# Patient Record
Sex: Female | Born: 1955 | Race: White | Hispanic: No | Marital: Single | Smoking: Former smoker
Health system: Southern US, Community
[De-identification: ages and names within clinical notes are randomized; demographics above are authoritative.]

## PROBLEM LIST (undated history)

## (undated) DIAGNOSIS — I471 Supraventricular tachycardia: Secondary | ICD-10-CM

---

## 2013-08-26 ENCOUNTER — Emergency Department (HOSPITAL_BASED_OUTPATIENT_CLINIC_OR_DEPARTMENT_OTHER)
Admission: EM | Admit: 2013-08-26 | Discharge: 2013-08-26 | Disposition: A | Discharge status: Home / Self Care | Payer: Federal, State, Local not specified - PPO | Attending: Emergency Medicine | Admitting: Emergency Medicine

## 2013-08-26 ENCOUNTER — Encounter (HOSPITAL_BASED_OUTPATIENT_CLINIC_OR_DEPARTMENT_OTHER): Payer: Self-pay | Admitting: Emergency Medicine

## 2013-08-26 ENCOUNTER — Emergency Department (HOSPITAL_BASED_OUTPATIENT_CLINIC_OR_DEPARTMENT_OTHER): Payer: Federal, State, Local not specified - PPO

## 2013-08-26 DIAGNOSIS — R002 Palpitations: Secondary | ICD-10-CM | POA: Insufficient documentation

## 2013-08-26 DIAGNOSIS — I471 Supraventricular tachycardia: Secondary | ICD-10-CM

## 2013-08-26 DIAGNOSIS — R51 Headache: Secondary | ICD-10-CM | POA: Insufficient documentation

## 2013-08-26 DIAGNOSIS — I498 Other specified cardiac arrhythmias: Secondary | ICD-10-CM | POA: Insufficient documentation

## 2013-08-26 LAB — COMPREHENSIVE METABOLIC PANEL
ALT: 34 U/L (ref 0–35)
AST: 30 U/L (ref 0–37)
Alkaline Phosphatase: 144 U/L — ABNORMAL HIGH (ref 39–117)
CO2: 27 mEq/L (ref 19–32)
Calcium: 10.7 mg/dL — ABNORMAL HIGH (ref 8.4–10.5)
Chloride: 103 mEq/L (ref 96–112)
GFR calc Af Amer: 72 mL/min — ABNORMAL LOW (ref >90)
GFR calc non Af Amer: 62 mL/min — ABNORMAL LOW (ref >90)
Glucose, Bld: 103 mg/dL — ABNORMAL HIGH (ref 70–99)
Sodium: 143 mEq/L (ref 135–145)
Total Bilirubin: 0.3 mg/dL (ref 0.3–1.2)

## 2013-08-26 LAB — CBC WITH DIFFERENTIAL/PLATELET
Basophils Relative: 0 % (ref 0–1)
Eosinophils Relative: 2 % (ref 0–5)
HCT: 45 % (ref 36.0–46.0)
Lymphocytes Relative: 43 % (ref 12–46)
Lymphs Abs: 3.3 10*3/uL (ref 0.7–4.0)
MCH: 29.8 pg (ref 26.0–34.0)
MCV: 86.5 fL (ref 78.0–100.0)
Platelets: 314 10*3/uL (ref 150–400)
RBC: 5.2 MIL/uL — ABNORMAL HIGH (ref 3.87–5.11)
RDW: 13 % (ref 11.5–15.5)
WBC: 7.8 10*3/uL (ref 4.0–10.5)

## 2013-08-26 MED ORDER — IBUPROFEN 200 MG PO TABS
600.0000 mg | ORAL_TABLET | Freq: Once | ORAL | Status: AC
  Administered 2013-08-26: 600 mg via ORAL
  Filled 2013-08-26 (×2): qty 1

## 2013-08-26 NOTE — ED Notes (Signed)
Patient expressed concern that we did not ask her permission to perform an ekg or run labs. Explained to patient  the reason for each procedure and the time factor for these procedures.  Explained the results of each test and patient verbalized understanding.

## 2013-08-26 NOTE — ED Notes (Signed)
Instructed pt to bear down x2 after EKG showed SVT @ 140.   Pt converted to sinus tachycardia. See second EKG.

## 2013-08-26 NOTE — ED Notes (Signed)
Patient c/o rapid heart rate since around 9:30 this morning.Patient states that she has had episodes in the past that only last around 5 minutes.no sob, some fatigue

## 2013-08-26 NOTE — ED Provider Notes (Signed)
CSN: [id number]     Arrival date & time 08/26/13  1012 History   First MD Initiated Contact with Patient 08/26/13 1025     Chief Complaint  Patient presents with  . Tachycardia   (Consider location/radiation/quality/duration/timing/severity/associated sxs/prior Treatment) HPI Comments: Patient is a 57 year old female with no significant past medical history. She presents today with complaints of rapid heart rate. She states that she woke this morning feeling well, then shortly afterward began with palpitations. She states that she has a fast heart rate that she says was in the 170s. This made her feel lightheaded and she actually developed a headache. She denies any chest pain or shortness of breath. She does admit to having episodes like this in the past, however these have only lasted for several minutes. She does report drinking 2 cups of coffee per day and he just finished drinking a coffee from [employer] just prior to the onset of the symptoms.  Patient is a 57 y.o. female presenting with palpitations. The history is provided by the patient.  Palpitations Palpitations quality:  Fast Onset quality:  Sudden Duration:  2 hours Timing:  Constant Progression:  Resolved Chronicity:  Recurrent Context: caffeine   Context: not anxiety and not appetite suppressants   Relieved by:  Nothing Worsened by:  Nothing tried Ineffective treatments:  None tried Associated symptoms: no chest pain, no nausea and no shortness of breath     History reviewed. No pertinent past medical history. Past Surgical History  Procedure Laterality Date  . Cesarean section     No family history on file. History  Substance Use Topics  . Smoking status: Never Smoker   . Smokeless tobacco: Not on file  . Alcohol Use: Yes   OB History   Grav Para Term Preterm Abortions TAB SAB Ect Mult Living                 Review of Systems  Respiratory: Negative for shortness of breath.   Cardiovascular: Positive for  palpitations. Negative for chest pain.  Gastrointestinal: Negative for nausea.  All other systems reviewed and are negative.    Allergies  Aspirin and Sulfa antibiotics  Home Medications  No current outpatient prescriptions on file. BP 149/99  Pulse 152  Temp(Src) 98 F (36.7 C)  Resp 20  SpO2 100% Physical Exam  Nursing note and vitals reviewed. Constitutional: She is oriented to person, place, and time. She appears well-developed and well-nourished. No distress.  HENT:  Head: Normocephalic and atraumatic.  Neck: Normal range of motion. Neck supple.  Cardiovascular: Normal rate and regular rhythm.  Exam reveals no gallop and no friction rub.   No murmur heard. Pulmonary/Chest: Effort normal and breath sounds normal. No respiratory distress. She has no wheezes.  Abdominal: Soft. Bowel sounds are normal. She exhibits no distension. There is no tenderness.  Musculoskeletal: Normal range of motion.  Neurological: She is alert and oriented to person, place, and time. No cranial nerve deficit. She exhibits normal muscle tone. Coordination normal.  Skin: Skin is warm and dry. She is not diaphoretic.    ED Course  Procedures (including critical care time) Labs Review Labs Reviewed - No data to display Imaging Review No results found.  EKG Interpretation     Ventricular Rate:  140 PR Interval:    QRS Duration: 84 QT Interval:  294 QTC Calculation: 448 R Axis:   18 Text Interpretation:  Supraventricular tachycardia Otherwise normal ECG  MDM  No diagnosis found. Patient is a 57 year old female presents with rapid heart rate. She is brought back from triage and initial EKG revealed a supraventricular tachycardia with rate of 140. This was converted to sinus rhythm with vagal maneuvers. Repeat EKG reveals a normal sinus rhythm with no acute changes. Workup reveals normal electrolytes, negative troponin, an unremarkable CBC. A TSH is pending at this time.    She is also complaining of headache was concerned that she had an aneurysm. Her neurologic exam is nonfocal.  CT scan of the head reveals no acute intracranial abnormalities.  She was observed in the ER for an extended period of time and had no further SVT or other ectopy. I believe she is stable for discharge to home. She will be given the number for cardiology to arrange a followup appointment to discuss her options.    Geoffery Lyons, MD 08/26/13 [phone]

## 2013-09-04 ENCOUNTER — Ambulatory Visit (INDEPENDENT_AMBULATORY_CARE_PROVIDER_SITE_OTHER): Payer: Federal, State, Local not specified - PPO | Admitting: Internal Medicine

## 2013-09-04 ENCOUNTER — Encounter: Payer: Self-pay | Admitting: Internal Medicine

## 2013-09-04 VITALS — BP 172/92 | HR 75 | Ht 66.0 in | Wt 178.4 lb

## 2013-09-04 DIAGNOSIS — R002 Palpitations: Secondary | ICD-10-CM

## 2013-09-04 DIAGNOSIS — I471 Supraventricular tachycardia: Secondary | ICD-10-CM

## 2013-09-04 DIAGNOSIS — I498 Other specified cardiac arrhythmias: Secondary | ICD-10-CM

## 2013-09-04 NOTE — Patient Instructions (Signed)
Your physician has requested that you have an echocardiogram. Echocardiography is a painless test that uses sound waves to create images of your heart. It provides your doctor with information about the size and shape of your heart and how well your heart's chambers and valves are working. This procedure takes approximately one hour. There are no restrictions for this procedure.  Please schedule a follow up visit after your test in about 2-3 weeks.

## 2013-09-04 NOTE — Progress Notes (Signed)
OFFICE NOTE  Chief Complaint:  Palpitations, SVT  Primary Care Physician: Rosario Adie, MD  HPI:  Ruth Erickson is a pleasant 57 year old female with no significant medical problems. She reports being fairly healthy and active. She exercises several times a week however recently he has been very active at work and has not been exercising as much as she had in the past. Has been associated with a few pounds of weight gain. On Sunday she reported getting ready for church and went to buy a [employer] coffee. She has coffee a few times a week. She did not yet drank any of the coffee when she felt that she had onset of palpitations and rapid heartbeat. The symptoms have presented previously over the past several years but typically when away within a few seconds. This time the symptoms did not go away and persisted for 10 or 15 minutes at that time she decided to go [facility] which was down the street from her home.  At [facility] she was noted to have a pulse in the 140s with a regular tachycardia. She reported taking her pulse when the fast rhythm started and it was over 200. She did feel slightly dizzy with this, but this did improve when she got to the emergency room. I personally reviewed her EKGs from the emergency room which demonstrated a small retrograde P wave after the QRS, indicating probably an accessory pathway. In the emergency room she underwent vagal maneuvers which eventually slowed her rhythm and then he converted back to sinus.  She was discharged with followup in our office.  She denied any chest pain or worsening shortness of breath with this.  PMHx:  History reviewed. No pertinent past medical history.  Past Surgical History  Procedure Laterality Date  . Cesarean section      FAMHx:  Family History  Problem Relation Age of Onset  . Hypertension Mother   . Diabetes Mother   . Asthma Father     SOCHx:   reports that she has quit smoking.  Her smoking use included Cigarettes. She has a 7.5 pack-year smoking history. She does not have any smokeless tobacco history on file. She reports that she drinks alcohol. She reports that she does not use illicit drugs.  ALLERGIES:  Allergies  Allergen Reactions  . Aspirin   . Sulfa Antibiotics     ROS: A comprehensive review of systems was negative except for: Cardiovascular: positive for palpitations Neurological: positive for dizziness  HOME MEDS: Current Outpatient Prescriptions  Medication Sig Dispense Refill  . CLOBEX SPRAY 0.05 % external spray       . multivitamin-iron-minerals-folic acid (CENTRUM) chewable tablet Chew 1 tablet by mouth daily.      . NON FORMULARY Take by mouth daily. oatstraw      . PATADAY 0.2 % SOLN       . triamcinolone lotion (KENALOG) 0.1 %        No current facility-administered medications for this visit.    LABS/IMAGING: No results found for this or any previous visit (from the past 48 hour(s)). No results found.  VITALS: BP 172/92  Pulse 75  Ht 5\' 6"  (1.676 m)  Wt 178 lb 6.4 oz (80.922 kg)  BMI 28.81 kg/m2  EXAM: General appearance: alert and no distress Neck: no carotid bruit and no JVD Lungs: clear to auscultation bilaterally Heart: regular rate and rhythm, S1, S2 normal, no murmur, click, rub or gallop Abdomen:  soft, non-tender; bowel sounds normal; no masses,  no organomegaly Extremities: extremities normal, atraumatic, no cyanosis or edema Pulses: 2+ and symmetric Skin: Skin color, texture, turgor normal. No rashes or lesions Neurologic: Grossly normal Psych: Mood, affect normal  EKG: deferred  ASSESSMENT: 1. Supraventricular tachycardia, likely AVNRT, symptomatic  PLAN: 1.   Ruth Erickson had acute onset tachypalpitations, which she's had on and off for several years, however this episode was associated with some dizziness and very rapid heart rate. He did seem to improve with vagal maneuvers and ultimately she broke back  into sinus rhythm. She denied any chest pain or shortness of breath with this episode. There is no family history of heart disease or arrhythmias. She does not have hypertension - a recheck of her blood pressure in the office today was 124/84. We had a long discussion about these type of tachycardia arrhythmias and their potential management options. They can be sometimes associated with structural heart disease, however do not hear any valvular abnormalities. I would recommend an echocardiogram to evaluate atrial sizes and left ventricular function. I've recommended low-dose beta blocker, but she is hesitant to take medicine.  I've also advised her that she could consider possible ablation for this arrhythmia and am happy to refer her to a cardiac electrophysiologist to discuss this further. She wishes to consider both options and will let me know once she comes back to discuss the results of her echocardiogram.  Chrystie Nose, MD, [facility] Attending Cardiologist [facility]  Elen Acero C 09/04/2013, 11:07 AM

## 2013-09-20 ENCOUNTER — Inpatient Hospital Stay (HOSPITAL_COMMUNITY): Admission: RE | Admit: 2013-09-20 | Payer: Federal, State, Local not specified - PPO | Source: Ambulatory Visit

## 2013-10-08 ENCOUNTER — Ambulatory Visit (HOSPITAL_COMMUNITY)
Admission: RE | Admit: 2013-10-08 | Discharge: 2013-10-08 | Disposition: A | Discharge status: Home / Self Care | Payer: Federal, State, Local not specified - PPO | Source: Ambulatory Visit | Attending: Cardiovascular Disease | Admitting: Cardiovascular Disease

## 2013-10-08 ENCOUNTER — Ambulatory Visit: Payer: Federal, State, Local not specified - PPO | Admitting: Internal Medicine

## 2013-10-08 DIAGNOSIS — I10 Essential (primary) hypertension: Secondary | ICD-10-CM | POA: Insufficient documentation

## 2013-10-08 DIAGNOSIS — F17200 Nicotine dependence, unspecified, uncomplicated: Secondary | ICD-10-CM | POA: Insufficient documentation

## 2013-10-08 DIAGNOSIS — I471 Supraventricular tachycardia, unspecified: Secondary | ICD-10-CM

## 2013-10-08 DIAGNOSIS — I498 Other specified cardiac arrhythmias: Secondary | ICD-10-CM | POA: Insufficient documentation

## 2013-10-08 DIAGNOSIS — R002 Palpitations: Secondary | ICD-10-CM

## 2013-10-08 NOTE — Progress Notes (Signed)
2D Echo Performed 10/08/2013    Clearence Ped, RCS

## 2013-10-10 ENCOUNTER — Ambulatory Visit (INDEPENDENT_AMBULATORY_CARE_PROVIDER_SITE_OTHER): Payer: Federal, State, Local not specified - PPO | Admitting: Internal Medicine

## 2013-10-10 ENCOUNTER — Encounter: Payer: Self-pay | Admitting: Internal Medicine

## 2013-10-10 VITALS — BP 140/88 | HR 76 | Ht 66.0 in | Wt 177.6 lb

## 2013-10-10 DIAGNOSIS — I340 Nonrheumatic mitral (valve) insufficiency: Secondary | ICD-10-CM

## 2013-10-10 DIAGNOSIS — I059 Rheumatic mitral valve disease, unspecified: Secondary | ICD-10-CM

## 2013-10-10 MED ORDER — METOPROLOL SUCCINATE ER 25 MG PO TB24: 12.5000 mg | ORAL_TABLET | Freq: Every day | ORAL | Status: DC

## 2013-10-10 NOTE — Patient Instructions (Signed)
Start Toprol XL 12.5mg  daily.  Your physician wants you to follow-up in: 1 year. You will receive a reminder letter in the mail two months in advance. If you don't receive a letter, please call our office to schedule the follow-up appointment.

## 2013-10-10 NOTE — Progress Notes (Signed)
OFFICE NOTE  Chief Complaint:  Palpitations, SVT  Primary Care Physician: Ruth Adie, MD  HPI:  Ruth Erickson is a pleasant 57 year old female with no significant medical problems. She reports being fairly healthy and active. She exercises several times a week however recently he has been very active at work and has not been exercising as much as she had in the past. Has been associated with a few pounds of weight gain. On Sunday she reported getting ready for church and went to buy a [employer] coffee. She has coffee a few times a week. She did not yet drank any of the coffee when she felt that she had onset of palpitations and rapid heartbeat. The symptoms have presented previously over the past several years but typically when away within a few seconds. This time the symptoms did not go away and persisted for 10 or 15 minutes at that time she decided to go to [facility] which was down the street from her home.  At [facility] she was noted to have a pulse in the 140s with a regular tachycardia. She reported taking her pulse when the fast rhythm started and it was over 200. She did feel slightly dizzy with this, but this did improve when she got to the emergency room. I personally reviewed her EKGs from the emergency room which demonstrated a small retrograde P wave after the QRS, indicating probably an accessory pathway. In the emergency room she underwent vagal maneuvers which eventually slowed her rhythm and then he converted back to sinus.  She was discharged with followup in our office.  She denied any chest pain or worsening shortness of breath with this.  Recently underwent an echocardiogram in our office which showed normal systolic function. There was some nodular sclerosis to the anterior mitral leaflet with mild to moderate mitral regurgitation. Otherwise no significant structural abnormalities.  PMHx:  History reviewed. No pertinent past medical  history.  Past Surgical History  Procedure Laterality Date  . Cesarean section      FAMHx:  Family History  Problem Relation Age of Onset  . Hypertension Mother   . Diabetes Mother   . Asthma Father     SOCHx:   reports that she quit smoking about 20 years ago. Her smoking use included Cigarettes. She has a 7.5 pack-year smoking history. She does not have any smokeless tobacco history on file. She reports that she drinks alcohol. She reports that she does not use illicit drugs.  ALLERGIES:  Allergies  Allergen Reactions  . Aspirin   . Sulfa Antibiotics     ROS: A comprehensive review of systems was negative except for: Cardiovascular: positive for palpitations Neurological: positive for dizziness  HOME MEDS: Current Outpatient Prescriptions  Medication Sig Dispense Refill  . CLOBEX SPRAY 0.05 % external spray       . multivitamin-iron-minerals-folic acid (CENTRUM) chewable tablet Chew 1 tablet by mouth daily.      . NON FORMULARY Take by mouth daily. oatstraw      . triamcinolone lotion (KENALOG) 0.1 %       . metoprolol succinate (TOPROL XL) 25 MG 24 hr tablet Take 0.5 tablets (12.5 mg total) by mouth daily.  15 tablet  11   No current facility-administered medications for this visit.    LABS/IMAGING: No results found for this or any previous visit (from the past 48 hour(s)). No results found.  VITALS: BP 140/88  Pulse 76  Ht 5\' 6"  (1.676 m)  Wt 177 lb 9.6 oz (80.559 kg)  BMI 28.68 kg/m2  EXAM: General appearance: alert and no distress Neck: no carotid bruit and no JVD Lungs: clear to auscultation bilaterally Heart: regular rate and rhythm, S1, S2 normal, no murmur, click, rub or gallop Abdomen: soft, non-tender; bowel sounds normal; no masses,  no organomegaly Extremities: extremities normal, atraumatic, no cyanosis or edema Pulses: 2+ and symmetric Skin: Skin color, texture, turgor normal. No rashes or lesions Neurologic: Grossly normal Psych: Mood,  affect normal  EKG: deferred  ASSESSMENT: 1. Supraventricular tachycardia, likely AVNRT, symptomatic 2. Mild to moderate mitral regurgitation  PLAN: 1.   [name] has mild to moderate mitral regurgitation on echo, but otherwise the heart looked structurally normal. She does have episodes of tachycardia palpitations every few months but not as serious as she had recently. I do think she would benefit from low-dose beta blocker, for a multitude of reasons. She is agreeable to taking low-dose Toprol-XL and I will plan to see her back annually or sooner if necessary.  Ruth Nose, MD, [facility] Attending Cardiologist [facility]  Ruth Erickson C 10/10/2013, 10:12 AM

## 2013-12-23 ENCOUNTER — Emergency Department (HOSPITAL_BASED_OUTPATIENT_CLINIC_OR_DEPARTMENT_OTHER): Payer: Federal, State, Local not specified - PPO

## 2013-12-23 ENCOUNTER — Emergency Department (HOSPITAL_BASED_OUTPATIENT_CLINIC_OR_DEPARTMENT_OTHER)
Admission: EM | Admit: 2013-12-23 | Discharge: 2013-12-23 | Disposition: A | Discharge status: Home / Self Care | Payer: Federal, State, Local not specified - PPO | Attending: Emergency Medicine | Admitting: Emergency Medicine

## 2013-12-23 ENCOUNTER — Encounter (HOSPITAL_BASED_OUTPATIENT_CLINIC_OR_DEPARTMENT_OTHER): Payer: Self-pay | Admitting: Emergency Medicine

## 2013-12-23 DIAGNOSIS — Z79899 Other long term (current) drug therapy: Secondary | ICD-10-CM | POA: Insufficient documentation

## 2013-12-23 DIAGNOSIS — D259 Leiomyoma of uterus, unspecified: Secondary | ICD-10-CM

## 2013-12-23 DIAGNOSIS — Z87891 Personal history of nicotine dependence: Secondary | ICD-10-CM | POA: Insufficient documentation

## 2013-12-23 DIAGNOSIS — R109 Unspecified abdominal pain: Secondary | ICD-10-CM

## 2013-12-23 DIAGNOSIS — K5900 Constipation, unspecified: Secondary | ICD-10-CM | POA: Insufficient documentation

## 2013-12-23 DIAGNOSIS — I498 Other specified cardiac arrhythmias: Secondary | ICD-10-CM | POA: Insufficient documentation

## 2013-12-23 HISTORY — DX: Supraventricular tachycardia: I47.1

## 2013-12-23 LAB — CBC WITH DIFFERENTIAL/PLATELET
Basophils Absolute: 0 10*3/uL (ref 0.0–0.1)
Basophils Relative: 0 % (ref 0–1)
Eosinophils Absolute: 0.2 10*3/uL (ref 0.0–0.7)
Eosinophils Relative: 2 % (ref 0–5)
HCT: 39.3 % (ref 36.0–46.0)
Hemoglobin: 13.5 g/dL (ref 12.0–15.0)
LYMPHS ABS: 3.4 10*3/uL (ref 0.7–4.0)
LYMPHS PCT: 37 % (ref 12–46)
MCH: 30.5 pg (ref 26.0–34.0)
MCHC: 34.4 g/dL (ref 30.0–36.0)
MCV: 88.7 fL (ref 78.0–100.0)
Monocytes Absolute: 0.6 10*3/uL (ref 0.1–1.0)
Monocytes Relative: 7 % (ref 3–12)
NEUTROS ABS: 5 10*3/uL (ref 1.7–7.7)
NEUTROS PCT: 54 % (ref 43–77)
PLATELETS: 271 10*3/uL (ref 150–400)
RBC: 4.43 MIL/uL (ref 3.87–5.11)
RDW: 13.9 % (ref 11.5–15.5)
WBC: 9.2 10*3/uL (ref 4.0–10.5)

## 2013-12-23 LAB — COMPREHENSIVE METABOLIC PANEL
ALK PHOS: 109 U/L (ref 39–117)
ALT: 12 U/L (ref 0–35)
AST: 20 U/L (ref 0–37)
Albumin: 3.9 g/dL (ref 3.5–5.2)
BUN: 12 mg/dL (ref 6–23)
CHLORIDE: 102 meq/L (ref 96–112)
CO2: 28 mEq/L (ref 19–32)
Calcium: 10.1 mg/dL (ref 8.4–10.5)
Creatinine, Ser: 0.8 mg/dL (ref 0.50–1.10)
GFR calc non Af Amer: 81 mL/min — ABNORMAL LOW (ref >90)
GLUCOSE: 92 mg/dL (ref 70–99)
POTASSIUM: 4.2 meq/L (ref 3.7–5.3)
SODIUM: 142 meq/L (ref 137–147)
TOTAL PROTEIN: 8 g/dL (ref 6.0–8.3)
Total Bilirubin: 0.3 mg/dL (ref 0.3–1.2)

## 2013-12-23 LAB — URINALYSIS, ROUTINE W REFLEX MICROSCOPIC
Bilirubin Urine: NEGATIVE
Glucose, UA: NEGATIVE mg/dL
Hgb urine dipstick: NEGATIVE
Ketones, ur: NEGATIVE mg/dL
NITRITE: NEGATIVE
Protein, ur: NEGATIVE mg/dL
Specific Gravity, Urine: 1.023 (ref 1.005–1.030)
UROBILINOGEN UA: 0.2 mg/dL (ref 0.0–1.0)
pH: 6.5 (ref 5.0–8.0)

## 2013-12-23 LAB — URINE MICROSCOPIC-ADD ON

## 2013-12-23 MED ORDER — IOHEXOL 300 MG/ML  SOLN
50.0000 mL | Freq: Once | INTRAMUSCULAR | Status: AC | PRN
  Administered 2013-12-23: 50 mL via ORAL

## 2013-12-23 MED ORDER — IBUPROFEN 800 MG PO TABS: 800.0000 mg | ORAL_TABLET | Freq: Three times a day (TID) | ORAL | Status: DC

## 2013-12-23 MED ORDER — KETOROLAC TROMETHAMINE 30 MG/ML IJ SOLN
30.0000 mg | Freq: Once | INTRAMUSCULAR | Status: AC
  Administered 2013-12-23: 30 mg via INTRAVENOUS
  Filled 2013-12-23: qty 1

## 2013-12-23 MED ORDER — IOHEXOL 300 MG/ML  SOLN
100.0000 mL | Freq: Once | INTRAMUSCULAR | Status: AC | PRN
  Administered 2013-12-23: 100 mL via INTRAVENOUS

## 2013-12-23 NOTE — Discharge Instructions (Signed)
Abdominal Pain, Women °Abdominal (stomach, pelvic, or belly) pain can be caused by many things. It is important to tell your doctor: °· The location of the pain. °· Does it come and go or is it present all the time? °· Are there things that start the pain (eating certain foods, exercise)? °· Are there other symptoms associated with the pain (fever, nausea, vomiting, diarrhea)? °All of this is helpful to know when trying to find the cause of the pain. °CAUSES  °· Stomach: virus or bacteria infection, or ulcer. °· Intestine: appendicitis (inflamed appendix), regional ileitis (Crohn's disease), ulcerative colitis (inflamed colon), irritable bowel syndrome, diverticulitis (inflamed diverticulum of the colon), or cancer of the stomach or intestine. °· Gallbladder disease or stones in the gallbladder. °· Kidney disease, kidney stones, or infection. °· Pancreas infection or cancer. °· Fibromyalgia (pain disorder). °· Diseases of the female organs: °· Uterus: fibroid (non-cancerous) tumors or infection. °· Fallopian tubes: infection or tubal pregnancy. °· Ovary: cysts or tumors. °· Pelvic adhesions (scar tissue). °· Endometriosis (uterus lining tissue growing in the pelvis and on the pelvic organs). °· Pelvic congestion syndrome (female organs filling up with blood just before the menstrual period). °· Pain with the menstrual period. °· Pain with ovulation (producing an egg). °· Pain with an IUD (intrauterine device, birth control) in the uterus. °· Cancer of the female organs. °· Functional pain (pain not caused by a disease, may improve without treatment). °· Psychological pain. °· Depression. °DIAGNOSIS  °Your doctor will decide the seriousness of your pain by doing an examination. °· Blood tests. °· X-rays. °· Ultrasound. °· CT scan (computed tomography, special type of X-ray). °· MRI (magnetic resonance imaging). °· Cultures, for infection. °· Barium enema (dye inserted in the large intestine, to better view it with  X-rays). °· Colonoscopy (looking in intestine with a lighted tube). °· Laparoscopy (minor surgery, looking in abdomen with a lighted tube). °· Major abdominal exploratory surgery (looking in abdomen with a large incision). °TREATMENT  °The treatment will depend on the cause of the pain.  °· Many cases can be observed and treated at home. °· Over-the-counter medicines recommended by your caregiver. °· Prescription medicine. °· Antibiotics, for infection. °· Birth control pills, for painful periods or for ovulation pain. °· Hormone treatment, for endometriosis. °· Nerve blocking injections. °· Physical therapy. °· Antidepressants. °· Counseling with a psychologist or psychiatrist. °· Minor or major surgery. °HOME CARE INSTRUCTIONS  °· Do not take laxatives, unless directed by your caregiver. °· Take over-the-counter pain medicine only if ordered by your caregiver. Do not take aspirin because it can cause an upset stomach or bleeding. °· Try a clear liquid diet (broth or water) as ordered by your caregiver. Slowly move to a bland diet, as tolerated, if the pain is related to the stomach or intestine. °· Have a thermometer and take your temperature several times a day, and record it. °· Bed rest and sleep, if it helps the pain. °· Avoid sexual intercourse, if it causes pain. °· Avoid stressful situations. °· Keep your follow-up appointments and tests, as your caregiver orders. °· If the pain does not go away with medicine or surgery, you may try: °· Acupuncture. °· Relaxation exercises (yoga, meditation). °· Group therapy. °· Counseling. °SEEK MEDICAL CARE IF:  °· You notice certain foods cause stomach pain. °· Your home care treatment is not helping your pain. °· You need stronger pain medicine. °· You want your IUD removed. °· You feel faint or   lightheaded.  You develop nausea and vomiting.  You develop a rash.  You are having side effects or an allergy to your medicine. SEEK IMMEDIATE MEDICAL CARE IF:   Your  pain does not go away or gets worse.  You have a fever.  Your pain is felt only in portions of the abdomen. The right side could possibly be appendicitis. The left lower portion of the abdomen could be colitis or diverticulitis.  You are passing blood in your stools (bright red or black tarry stools, with or without vomiting).  You have blood in your urine.  You develop chills, with or without a fever.  You pass out. MAKE SURE YOU:   Understand these instructions.  Will watch your condition.  Will get help right away if you are not doing well or get worse. Document Released: 08/15/2007 Document Revised: 01/10/2012 Document Reviewed: 09/04/2009 [facility] Patient Information 2014 [place], [place].  Fibroids Fibroids are lumps (tumors) that can occur any place in a woman's body. These lumps are not cancerous. Fibroids vary in size, weight, and where they grow. HOME CARE  Do not take aspirin.  Write down the number of pads or tampons you use during your period. Tell your doctor. This can help determine the best treatment for you. GET HELP RIGHT AWAY IF:  You have pain in your lower belly (abdomen) that is not helped with medicine.  You have cramps that are not helped with medicine.  You have more bleeding between or during your period.  You feel lightheaded or pass out (faint).  Your lower belly pain gets worse. MAKE SURE YOU:  Understand these instructions.  Will watch your condition.  Will get help right away if you are not doing well or get worse. Document Released: 11/20/2010 Document Revised: 01/10/2012 Document Reviewed: 11/20/2010 [facility] Patient Information 2014 [place], [place].

## 2013-12-23 NOTE — ED Notes (Signed)
Patient is having lower right abd pain, that radiates to right flank area and down right thigh.

## 2013-12-23 NOTE — ED Provider Notes (Signed)
Medical screening examination/treatment/procedure(s) were conducted as a shared visit with non-physician practitioner(s) and myself.  I personally evaluated the patient during the encounter.  EKG Interpretation   None         Merryl Hacker, MD 12/23/13 [place]ly [place]

## 2013-12-23 NOTE — ED Provider Notes (Signed)
5:46 PM Results for orders placed during the hospital encounter of 12/23/13  URINALYSIS, ROUTINE W REFLEX MICROSCOPIC      Result Value Ref Range   Color, Urine YELLOW  YELLOW   APPearance CLEAR  CLEAR   Specific Gravity, Urine 1.023  1.005 - 1.030   pH 6.5  5.0 - 8.0   Glucose, UA NEGATIVE  NEGATIVE mg/dL   Hgb urine dipstick NEGATIVE  NEGATIVE   Bilirubin Urine NEGATIVE  NEGATIVE   Ketones, ur NEGATIVE  NEGATIVE mg/dL   Protein, ur NEGATIVE  NEGATIVE mg/dL   Urobilinogen, UA 0.2  0.0 - 1.0 mg/dL   Nitrite NEGATIVE  NEGATIVE   Leukocytes, UA TRACE (*) NEGATIVE  CBC WITH DIFFERENTIAL      Result Value Ref Range   WBC 9.2  4.0 - 10.5 K/uL   RBC 4.43  3.87 - 5.11 MIL/uL   Hemoglobin 13.5  12.0 - 15.0 g/dL   HCT 39.3  36.0 - 46.0 %   MCV 88.7  78.0 - 100.0 fL   MCH 30.5  26.0 - 34.0 pg   MCHC 34.4  30.0 - 36.0 g/dL   RDW 13.9  11.5 - 15.5 %   Platelets 271  150 - 400 K/uL   Neutrophils Relative % 54  43 - 77 %   Neutro Abs 5.0  1.7 - 7.7 K/uL   Lymphocytes Relative 37  12 - 46 %   Lymphs Abs 3.4  0.7 - 4.0 K/uL   Monocytes Relative 7  3 - 12 %   Monocytes Absolute 0.6  0.1 - 1.0 K/uL   Eosinophils Relative 2  0 - 5 %   Eosinophils Absolute 0.2  0.0 - 0.7 K/uL   Basophils Relative 0  0 - 1 %   Basophils Absolute 0.0  0.0 - 0.1 K/uL  COMPREHENSIVE METABOLIC PANEL      Result Value Ref Range   Sodium 142  137 - 147 mEq/L   Potassium 4.2  3.7 - 5.3 mEq/L   Chloride 102  96 - 112 mEq/L   CO2 28  19 - 32 mEq/L   Glucose, Bld 92  70 - 99 mg/dL   BUN 12  6 - 23 mg/dL   Creatinine, Ser 0.80  0.50 - 1.10 mg/dL   Calcium 10.1  8.4 - 10.5 mg/dL   Total Protein 8.0  6.0 - 8.3 g/dL   Albumin 3.9  3.5 - 5.2 g/dL   AST 20  0 - 37 U/L   ALT 12  0 - 35 U/L   Alkaline Phosphatase 109  39 - 117 U/L   Total Bilirubin 0.3  0.3 - 1.2 mg/dL   GFR calc non Af Amer 81 (*) >90 mL/min   GFR calc Af Amer >90  >90 mL/min  URINE MICROSCOPIC-ADD ON      Result Value Ref Range   Squamous  Epithelial / LPF RARE  RARE   WBC, UA 3-6  <3 WBC/hpf   Bacteria, UA MANY (*) RARE   Dg Abd 1 View  12/23/2013   CLINICAL DATA:  Right lower quadrant pain  EXAM: ABDOMEN - 1 VIEW  COMPARISON:  None.  FINDINGS: No disproportionate dilatation of bowel. No obvious free intraperitoneal gas. Prominent stool burden throughout the colon. Unremarkable bony framework.  IMPRESSION: Nonobstructive bowel gas pattern.  Prominent stool.   Electronically Signed   By: Maryclare Bean M.D.   On: 12/23/2013 14:27   Ct Abdomen Pelvis W Contrast  12/23/2013  CLINICAL DATA:  Right lower quadrant flank pain, abdominal pain  EXAM: CT ABDOMEN AND PELVIS WITH CONTRAST  TECHNIQUE: Multidetector CT imaging of the abdomen and pelvis was performed using the standard protocol following bolus administration of intravenous contrast. Sagittal and coronal MPR images reconstructed from axial data set.  CONTRAST:  81mL OMNIPAQUE IOHEXOL 300 MG/ML SOLN, 160mL OMNIPAQUE IOHEXOL 300 MG/ML SOLN  COMPARISON:  None  FINDINGS: Lung bases clear.  Liver, spleen, pancreas, kidneys, and adrenal glands normal appearance.  Normal appendix.  Multiple masses in uterus likely uterine leiomyomata, largest 3.7 cm diameter.  Unremarkable bladder and ureters.  Stomach and bowel loops normal appearance.  No mass, adenopathy, free fluid or inflammatory process.  No acute osseous findings.  IMPRESSION: Fibroid uterus.  No acute intra-abdominal or intrapelvic abnormalities.   Electronically Signed   By: Lavonia Dana M.D.   On: 12/23/2013 17:04    Care of patient taken over from Dr. Dina Rich, review of CT scan reveals nothing to suggest the cause of the pain though the patient does state that the pain does feel like her previous fibroid pain.  She has an appointment in late March with her GYN for follow up and she will try to move this up from that point.  She requests ibuprofen for pain and does not want narcotic pain medication.    Idalia Needle Joelyn Oms, [place] 12/23/13  [phone]

## 2013-12-23 NOTE — ED Provider Notes (Signed)
CSN: [id number]     Arrival date & time 12/23/13  1333 History   First MD Initiated Contact with Patient 12/23/13 1353     Chief Complaint  Patient presents with  . Abdominal Pain     (Consider location/radiation/quality/duration/timing/severity/associated sxs/prior Treatment) HPI  This is a 58 yo female with history of SVT and cesarean section who presents with right lower quadrant pain. Patient reports onset of symptoms yesterday. She reports right flank right lower quadrant and right groin pain.  She describes the pain as aching sharp and constant. Positioning makes the pain better or worse. Patient took Tylenol home without relief. She denies any nausea, vomiting, diarrhea, urinary symptoms. She denies any history of hematuria or kidney stones.  Currently patient's pain is 7/10. Patient does feel that she has been constipated lately; however, she had 2 normal bowel movements this morning. She denies any blood in her stool.  Past Medical History  Diagnosis Date  . SVT (supraventricular tachycardia)    Past Surgical History  Procedure Laterality Date  . Cesarean section     Family History  Problem Relation Age of Onset  . Hypertension Mother   . Diabetes Mother   . Asthma Father    History  Substance Use Topics  . Smoking status: Former Smoker -- 0.50 packs/day for 15 years    Types: Cigarettes    Quit date: 10/10/1993  . Smokeless tobacco: Not on file  . Alcohol Use: Yes   OB History   Grav Para Term Preterm Abortions TAB SAB Ect Mult Living                 Review of Systems  Constitutional: Negative for fever.  Respiratory: Negative for cough, chest tightness and shortness of breath.   Cardiovascular: Negative for chest pain.  Gastrointestinal: Positive for abdominal pain and constipation. Negative for nausea, vomiting, diarrhea, blood in stool and rectal pain.  Genitourinary: Positive for flank pain. Negative for dysuria and hematuria.  Musculoskeletal: Negative  for back pain.  Neurological: Negative for headaches.  Psychiatric/Behavioral: Negative for confusion.  All other systems reviewed and are negative.      Allergies  Aspirin and Sulfa antibiotics  Home Medications   Current Outpatient Rx  Name  Route  Sig  Dispense  Refill  . CLOBEX SPRAY 0.05 % external spray               . metoprolol succinate (TOPROL XL) 25 MG 24 hr tablet   Oral   Take 0.5 tablets (12.5 mg total) by mouth daily.   15 tablet   11   . multivitamin-iron-minerals-folic acid (CENTRUM) chewable tablet   Oral   Chew 1 tablet by mouth daily.         . NON FORMULARY   Oral   Take by mouth daily. oatstraw         . triamcinolone lotion (KENALOG) 0.1 %                BP 159/76  Pulse 72  Temp(Src) 98.5 F (36.9 C) (Oral)  Resp 18  Wt 175 lb (79.379 kg)  SpO2 100% Physical Exam  Nursing note and vitals reviewed. Constitutional: She is oriented to person, place, and time. She appears well-developed and well-nourished. No distress.  HENT:  Head: Normocephalic and atraumatic.  Mouth/Throat: Oropharynx is clear and moist.  Neck: Neck supple.  Cardiovascular: Normal rate, regular rhythm and normal heart sounds.   No murmur heard. Pulmonary/Chest: Effort normal and  breath sounds normal. No respiratory distress. She has no wheezes.  Abdominal: Soft. Bowel sounds are normal. She exhibits no distension. There is no tenderness. There is no rebound and no guarding.  Genitourinary:  No CVA tenderness noted  Musculoskeletal: She exhibits no edema.  Neurological: She is alert and oriented to person, place, and time.  Skin: Skin is warm and dry.  Psychiatric: She has a normal mood and affect.    ED Course  Procedures (including critical care time) Labs Review Labs Reviewed  URINALYSIS, ROUTINE W REFLEX MICROSCOPIC - Abnormal; Notable for the following:    Leukocytes, UA TRACE (*)    All other components within normal limits  COMPREHENSIVE  METABOLIC PANEL - Abnormal; Notable for the following:    GFR calc non Af Amer 81 (*)    All other components within normal limits  URINE MICROSCOPIC-ADD ON - Abnormal; Notable for the following:    Bacteria, UA MANY (*)    All other components within normal limits  CBC WITH DIFFERENTIAL   Imaging Review Dg Abd 1 View  12/23/2013   CLINICAL DATA:  Right lower quadrant pain  EXAM: ABDOMEN - 1 VIEW  COMPARISON:  None.  FINDINGS: No disproportionate dilatation of bowel. No obvious free intraperitoneal gas. Prominent stool burden throughout the colon. Unremarkable bony framework.  IMPRESSION: Nonobstructive bowel gas pattern.  Prominent stool.   Electronically Signed   By: Maryclare Bean M.D.   On: 12/23/2013 14:27    EKG Interpretation   None      Medications  iohexol (OMNIPAQUE) 300 MG/ML solution 50 mL (not administered)  ketorolac (TORADOL) 30 MG/ML injection 30 mg (not administered)    MDM   Final diagnoses:  None    Patient presents with right flank, right lower quadrant, and right groin pain.  She is nontoxic on exam. No signs of peritonitis or tenderness. Lab work is reassuring without evidence of urinary tract infection with leukocytosis. Considerations include kidney stone, appendicitis although the patient is relatively nontender, or musculoskeletal. Patient will get a CT scan of the abdomen for further evaluation.    Merryl Hacker, MD 12/23/13 [phone]

## 2014-06-20 ENCOUNTER — Telehealth: Payer: Self-pay | Admitting: Internal Medicine

## 2014-06-20 NOTE — Telephone Encounter (Signed)
Forward to Dr Debara Pickett

## 2014-06-20 NOTE — Telephone Encounter (Signed)
Pt need to know if she can stop her Metoprolol for 3 days so she can have an allergy test.

## 2014-06-21 ENCOUNTER — Encounter: Payer: Self-pay | Admitting: *Deleted

## 2014-06-21 NOTE — Telephone Encounter (Signed)
Letter composed and emailed to patient.

## 2014-06-21 NOTE — Telephone Encounter (Signed)
Patient notified. Needs letter emailed to [email or web address]

## 2014-06-21 NOTE — Telephone Encounter (Signed)
yes

## 2014-10-08 ENCOUNTER — Other Ambulatory Visit: Payer: Self-pay

## 2014-10-08 MED ORDER — METOPROLOL SUCCINATE ER 25 MG PO TB24: 12.5000 mg | ORAL_TABLET | Freq: Every day | ORAL | Status: DC

## 2014-10-08 NOTE — Telephone Encounter (Signed)
Rx sent to pharmacy   

## 2014-11-07 ENCOUNTER — Ambulatory Visit (INDEPENDENT_AMBULATORY_CARE_PROVIDER_SITE_OTHER): Payer: Federal, State, Local not specified - PPO | Admitting: Internal Medicine

## 2014-11-07 ENCOUNTER — Encounter: Payer: Self-pay | Admitting: Internal Medicine

## 2014-11-07 VITALS — BP 132/78 | HR 69 | Ht 66.0 in | Wt 179.3 lb

## 2014-11-07 DIAGNOSIS — I340 Nonrheumatic mitral (valve) insufficiency: Secondary | ICD-10-CM

## 2014-11-07 DIAGNOSIS — I471 Supraventricular tachycardia: Secondary | ICD-10-CM

## 2014-11-07 NOTE — Progress Notes (Signed)
OFFICE NOTE  Chief Complaint:  No complaints  Primary Care Physician: Miguel Aschoff, MD  HPI:  Ruth Erickson is a pleasant 59 year old female with no significant medical problems. She reports being fairly healthy and active. She exercises several times a week however recently he has been very active at work and has not been exercising as much as she had in the past. Has been associated with a few pounds of weight gain. On Sunday she reported getting ready for church and went to buy a [employer] coffee. She has coffee a few times a week. She did not yet drank any of the coffee when she felt that she had onset of palpitations and rapid heartbeat. The symptoms have presented previously over the past several years but typically when away within a few seconds. This time the symptoms did not go away and persisted for 10 or 15 minutes at that time she decided to go to [facility] which was down the street from her home.  At [facility] she was noted to have a pulse in the 140s with a regular tachycardia. She reported taking her pulse when the fast rhythm started and it was over 200. She did feel slightly dizzy with this, but this did improve when she got to the emergency room. I personally reviewed her EKGs from the emergency room which demonstrated a small retrograde P wave after the QRS, indicating probably an accessory pathway. In the emergency room she underwent vagal maneuvers which eventually slowed her rhythm and then he converted back to sinus.  She was discharged with followup in our office.  She denied any chest pain or worsening shortness of breath with this.  Recently underwent an echocardiogram in our office which showed normal systolic function. There was some nodular sclerosis to the anterior mitral leaflet with mild to moderate mitral regurgitation. Otherwise no significant structural abnormalities.  [name] returns today for follow-up. She reports in the  interim she has completely stopped taking her Toprol. She weaned off the medication and is started taking oil supplements. These over-the-counter products according to her specifically designed to help with her rhythm problems. She says that she's ever felt better. She is also exercising and working on weight loss and dietary changes. She denies any shortness of breath or palpitations.   PMHx:  Past Medical History  Diagnosis Date  . SVT (supraventricular tachycardia)     Past Surgical History  Procedure Laterality Date  . Cesarean section      FAMHx:  Family History  Problem Relation Age of Onset  . Hypertension Mother   . Diabetes Mother   . Asthma Father     SOCHx:   reports that she quit smoking about 21 years ago. Her smoking use included Cigarettes. She has a 7.5 pack-year smoking history. She does not have any smokeless tobacco history on file. She reports that she drinks alcohol. She reports that she does not use illicit drugs.  ALLERGIES:  Allergies  Allergen Reactions  . Almond Oil     Scratchy throat - almond milk   . Aspirin   . Sulfa Antibiotics     ROS: A comprehensive review of systems was negative.  HOME MEDS: Current Outpatient Prescriptions  Medication Sig Dispense Refill  . NON FORMULARY Take by mouth daily. oatstraw    . triamcinolone lotion (KENALOG) 0.1 %      No current facility-administered medications for this visit.    LABS/IMAGING: No  results found for this or any previous visit (from the past 38 hour(s)). No results found.  VITALS: BP 132/78 mmHg  Pulse 69  Ht 5\' 6"  (1.676 m)  Wt 179 lb 4.8 oz (81.33 kg)  BMI 28.95 kg/m2  EXAM: General appearance: alert and no distress Neck: no carotid bruit and no JVD Lungs: clear to auscultation bilaterally Heart: regular rate and rhythm, S1, S2 normal, no murmur, click, rub or gallop Abdomen: soft, non-tender; bowel sounds normal; no masses,  no organomegaly Extremities: extremities normal,  atraumatic, no cyanosis or edema Pulses: 2+ and symmetric Skin: Skin color, texture, turgor normal. No rashes or lesions Neurologic: Grossly normal Psych: Mood, affect normal  EKG: Normal sinus rhythm at 69  ASSESSMENT: 1. 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 Mild to moderate mitral regurgitation  PLAN: 1.   Ms. Manz has not had recurrence of her palpitations. She weighed herself off of beta blocker. I told her she could be at risk for recurrent palpitations. She is currently taking some over-the-counter oil supplement which she feels will be sufficient for her. She does have mild to moderate mitral regurgitation it should be reassessed by echo in 3-5 years. I'm happy to see her back as needed. Currently she feels that she is doing well and is on a cardiac care.  Pixie Casino, MD, [facility] Attending Cardiologist [facility]  Marianela Mandrell C 11/07/2014, 2:17 PM

## 2014-11-07 NOTE — Patient Instructions (Signed)
Your physician recommends that you schedule a follow-up appointment as needed  

## 2014-11-15 IMAGING — CT CT HEAD W/O CM
1 series · 16 of 30 positions shown, 20 images · non-contrast
Comparison: None.

CLINICAL DATA: Persistent headache

EXAM:
CT HEAD WITHOUT CONTRAST
TECHNIQUE: Contiguous axial images were obtained from the base of the skull
through the vertex without intravenous contrast. Study was obtained
within 24 hr of patient's arrival at the emergency department.

[Series 2: head 4.8 h37s · axial · 0.45mm/px · z∈[-147,-14]mm · 16 of 32 slices shown, 20 images]
[im 2/32  brain]
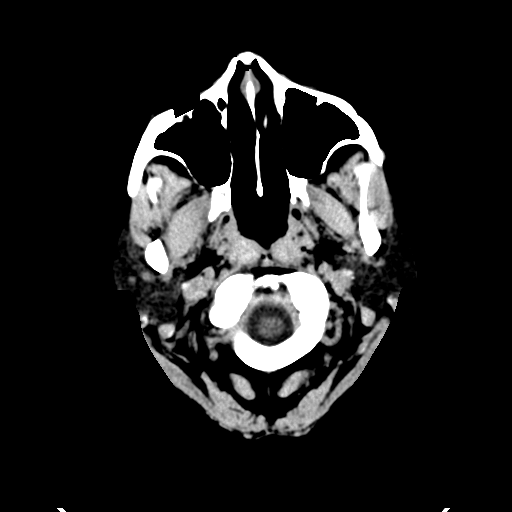
[im 2/32  bone]
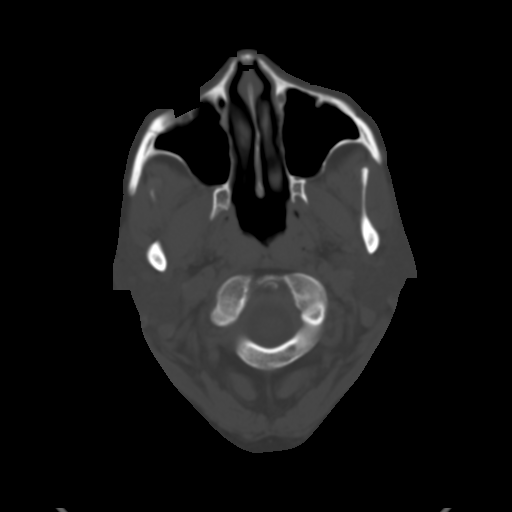
[im 4/32  brain]
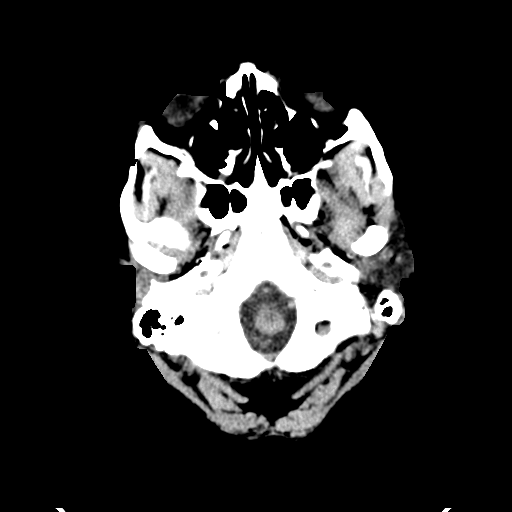
[im 6/32  brain]
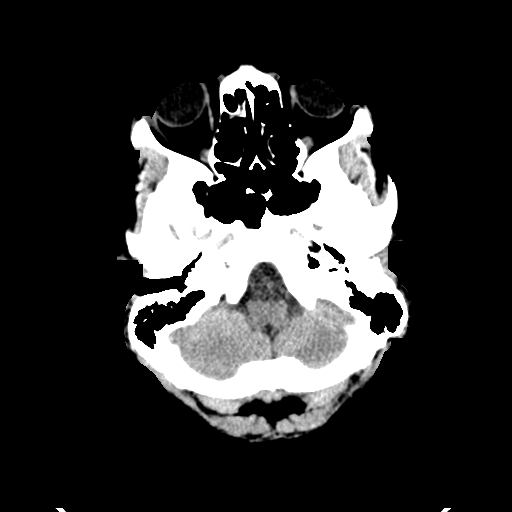
[im 8/32  brain]
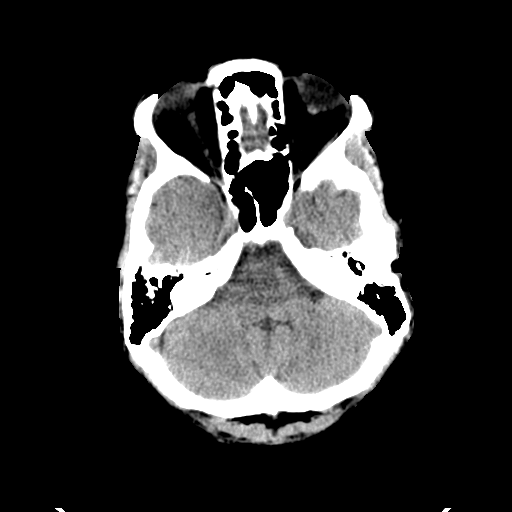
[im 9/32  brain]
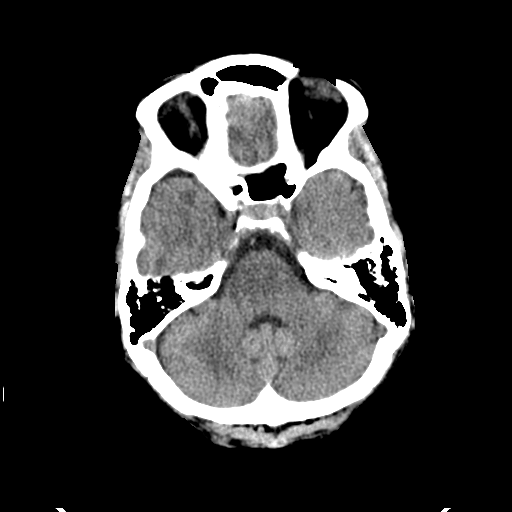
[im 9/32  bone]
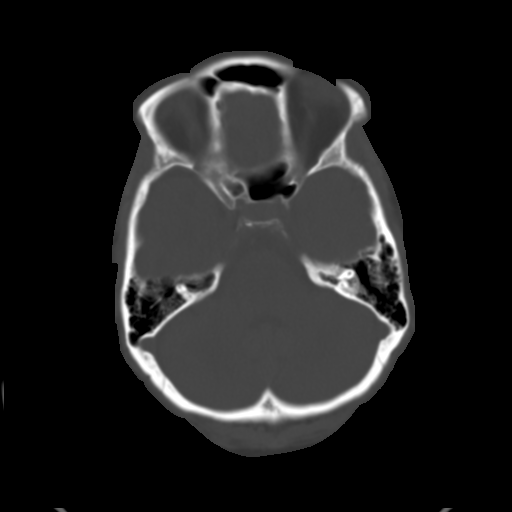
[im 11/32  brain]
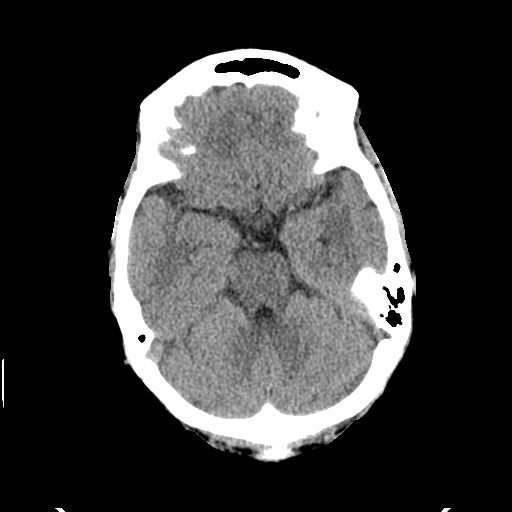
[im 13/32  brain]
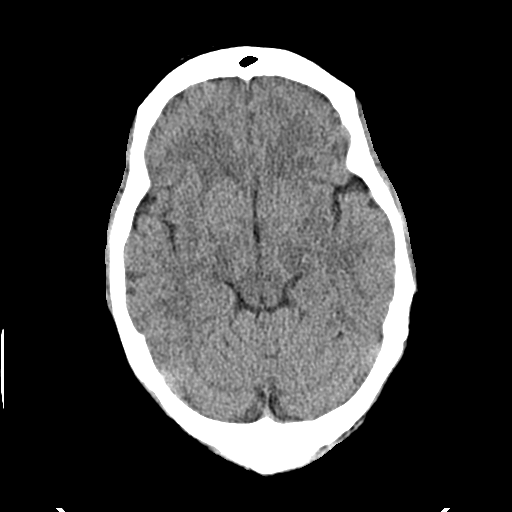
[im 15/32  brain]
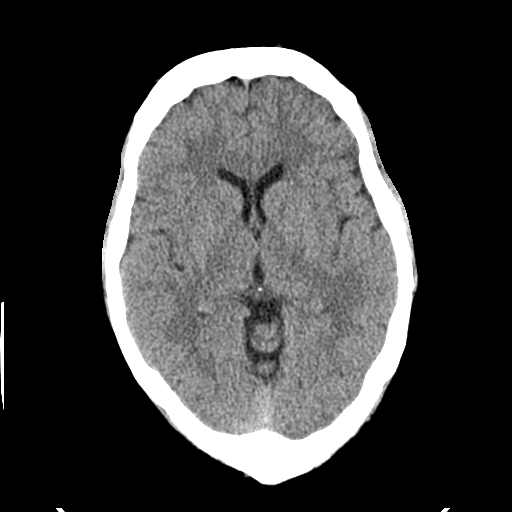
[im 17/32  brain]
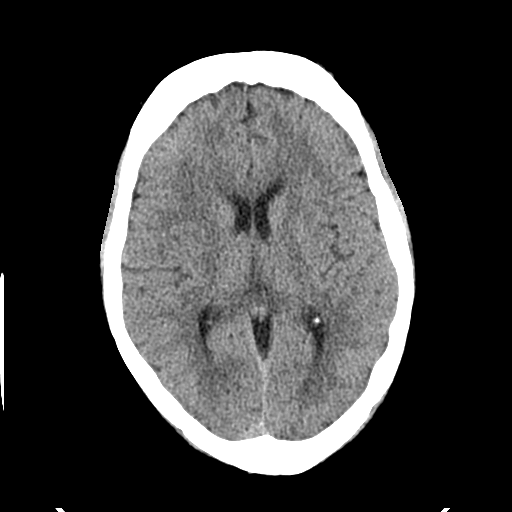
[im 17/32  bone]
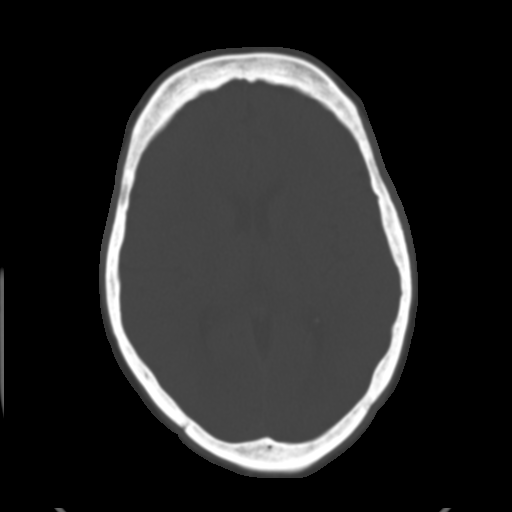
[im 19/32  brain]
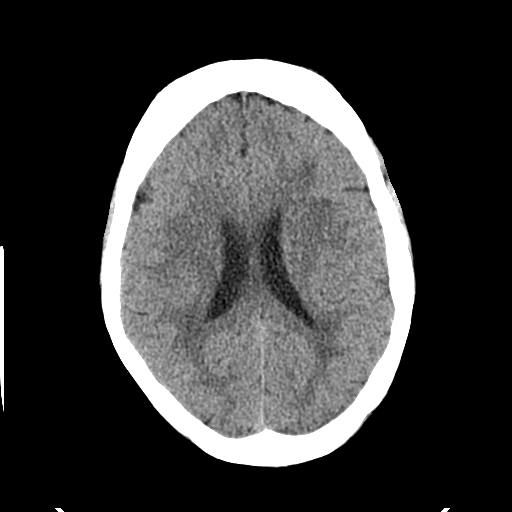
[im 21/32  brain]
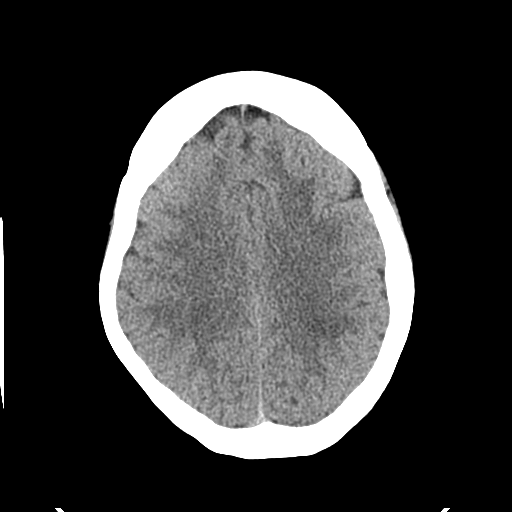
[im 23/32  brain]
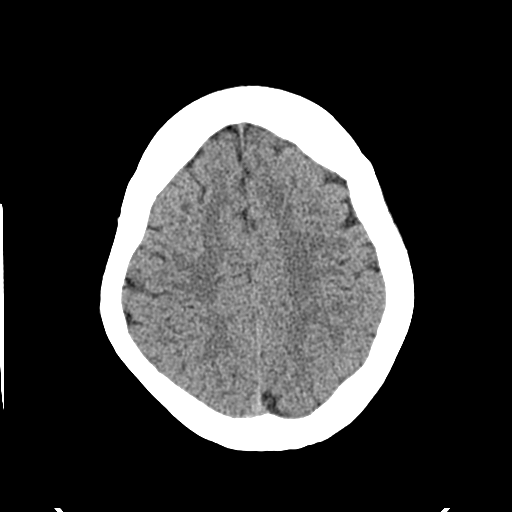
[im 24/32  brain]
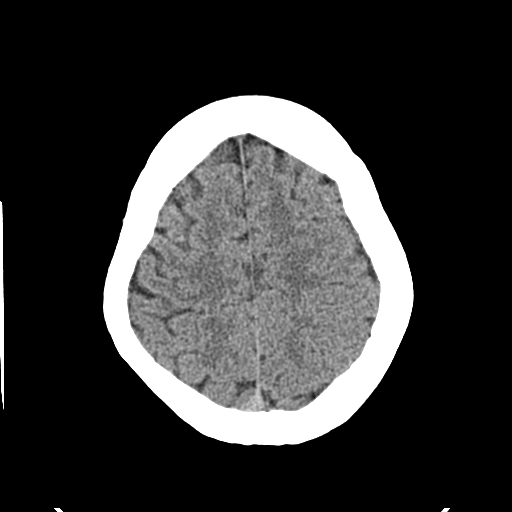
[im 24/32  bone]
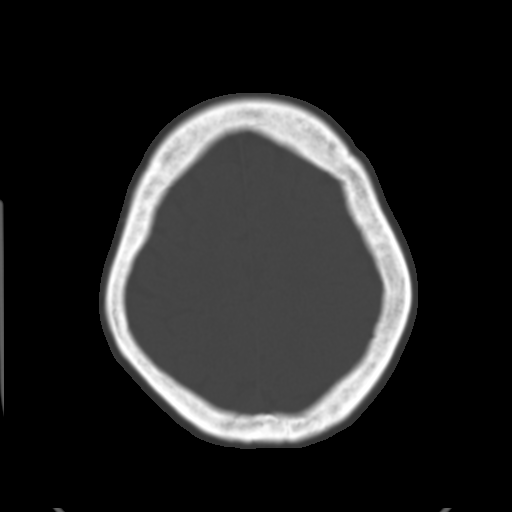
[im 26/32  brain]
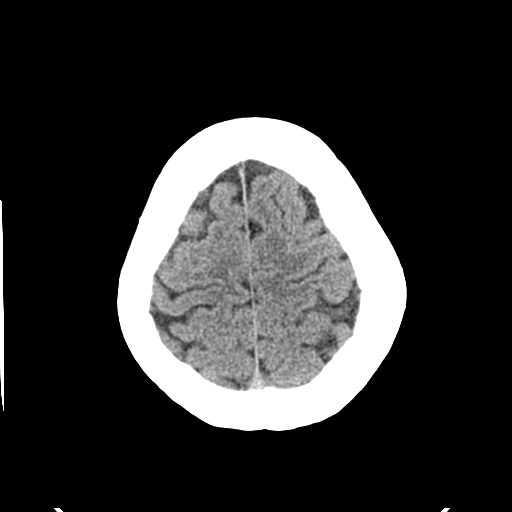
[im 28/32  brain]
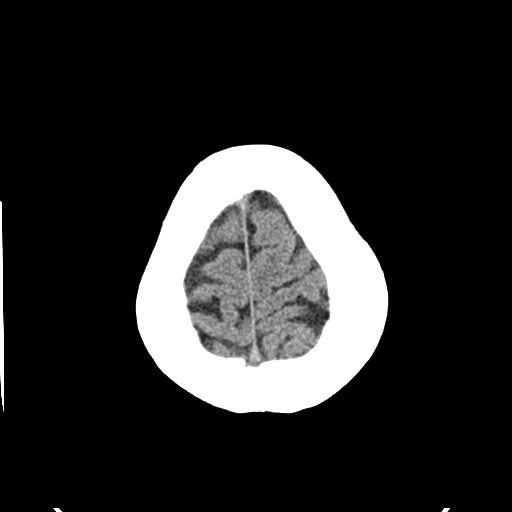
[im 30/32  brain]
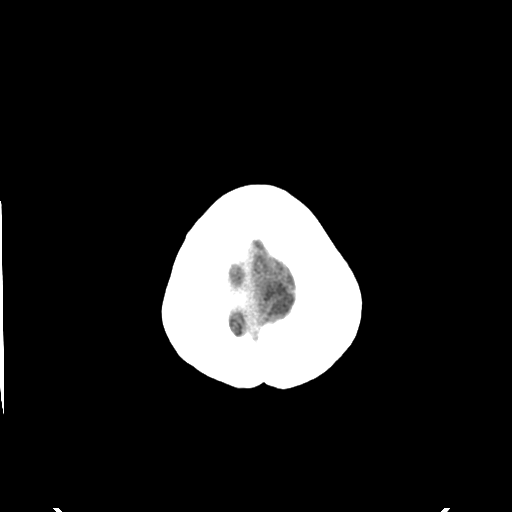

[16 of 30 positions shown; findings below may reference images not displayed]

FINDINGS: The ventricles are normal in size and configuration. There is no
mass, hemorrhage, extra-axial fluid collection, or midline shift.
There is patchy small vessel disease in the centra semiovale
bilaterally. There is evidence of a small lacunar infarct in the
lateral left mid cerebellum, nonacute. No acute appearing infarct is
seen.

Bony calvarium appears intact. The mastoid air cells are clear.
IMPRESSION: Patchy periventricular small vessel disease. Prior small left
cerebellar lacunar infarct. Study otherwise unremarkable.

## 2015-03-14 IMAGING — CR DG ABDOMEN 1V
1 series · 1 of 1 positions shown · non-contrast
Comparison: None.

CLINICAL DATA: Right lower quadrant pain

EXAM:
ABDOMEN - 1 VIEW

[t abdomen supine]
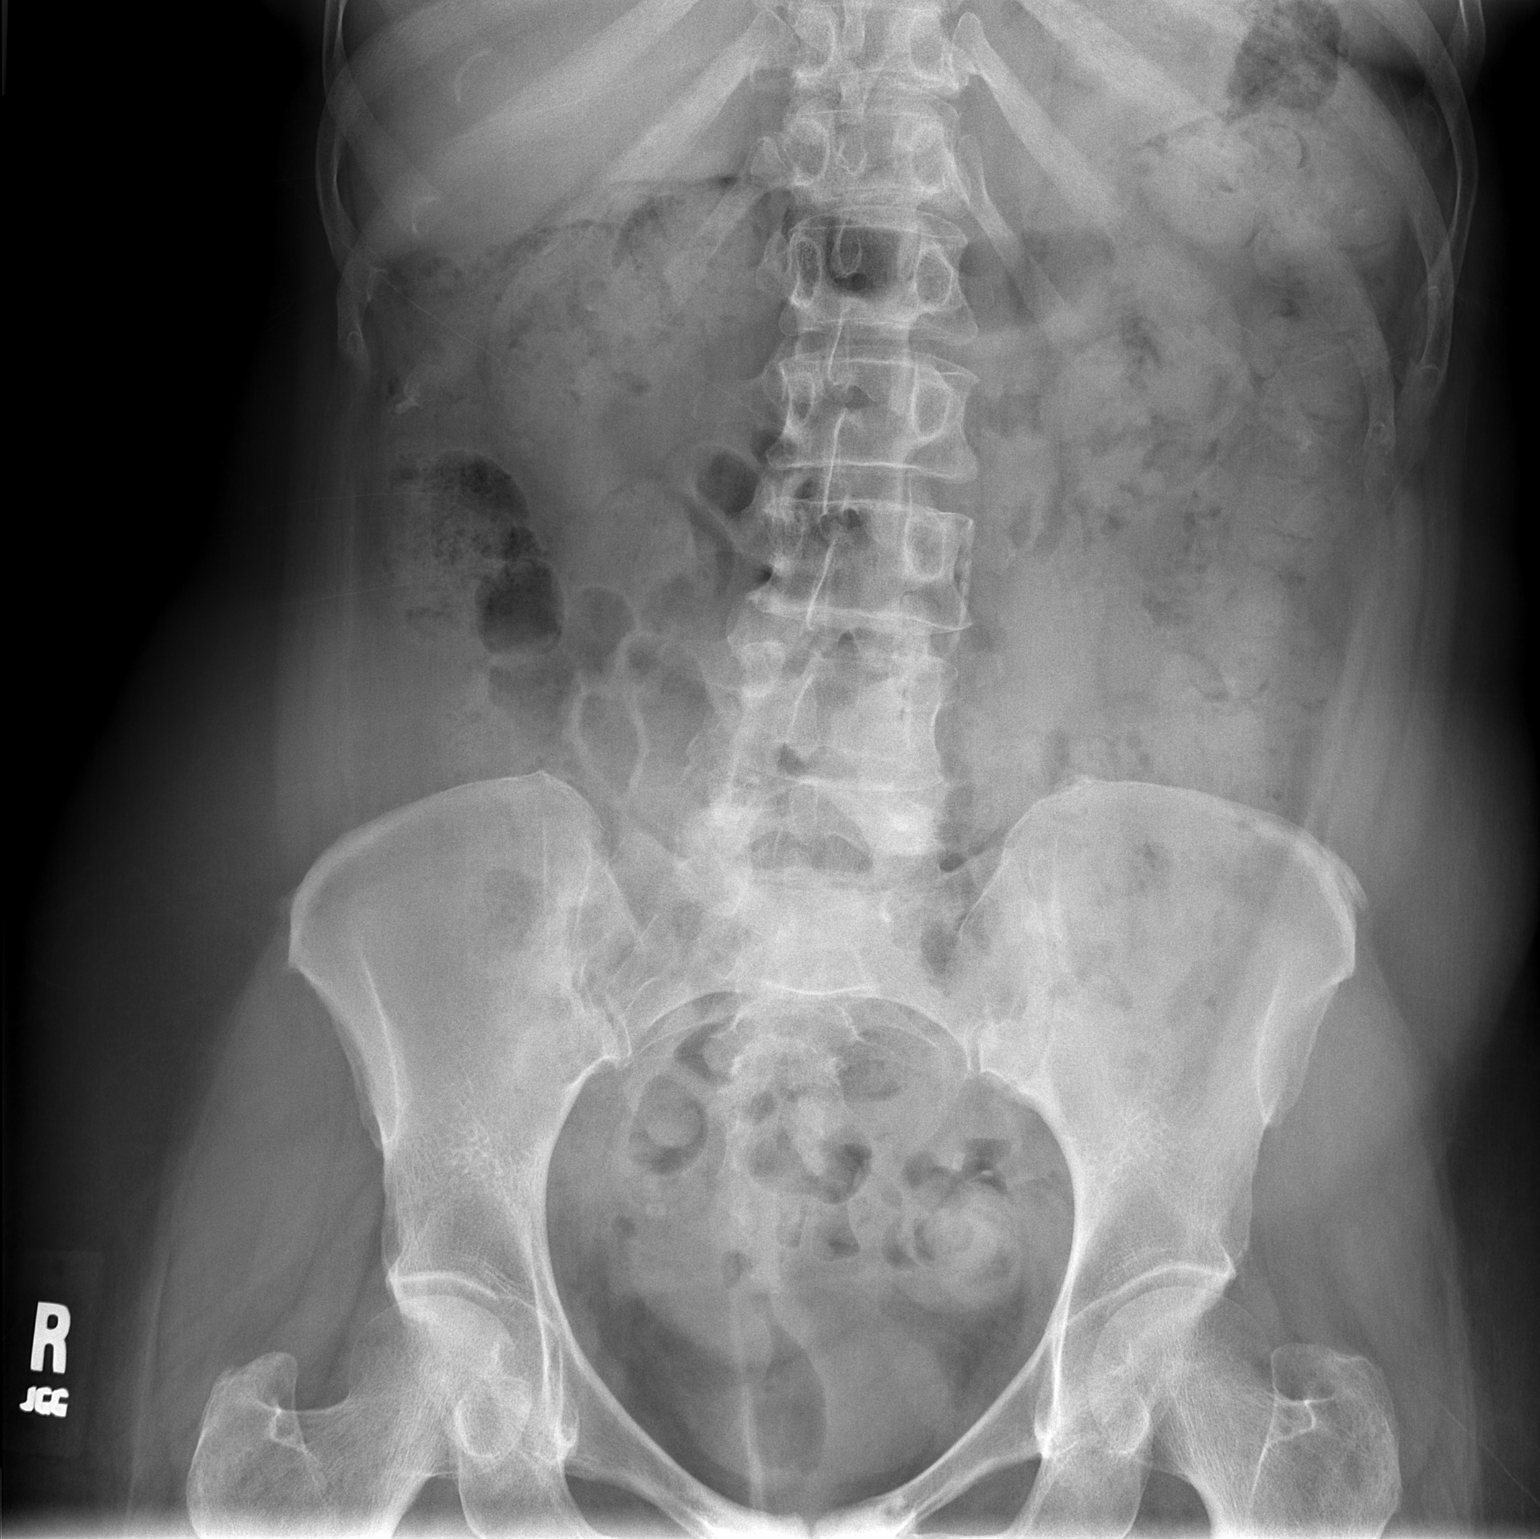

[1 of 1 positions shown; findings below may reference images not displayed]

FINDINGS: No disproportionate dilatation of bowel. No obvious free
intraperitoneal gas. Prominent stool burden throughout the colon.
Unremarkable bony framework.
IMPRESSION: Nonobstructive bowel gas pattern.  Prominent stool.
